# Patient Record
Sex: Female | Born: 2005 | Race: Black or African American | Hispanic: No | Marital: Single | Smoking: Never smoker
Health system: Southern US, Community
[De-identification: ages and names within clinical notes are randomized; demographics above are authoritative.]

---
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
## 2023-01-11 ENCOUNTER — Telehealth: Payer: Self-pay | Admitting: Family Medicine

## 2023-01-11 ENCOUNTER — Ambulatory Visit: Payer: 59 | Admitting: Family Medicine

## 2023-01-11 NOTE — Telephone Encounter (Signed)
NS no reason given

## 2023-01-12 NOTE — Telephone Encounter (Signed)
1st no show, sent text with policy (no mychart)

## 2023-09-01 ENCOUNTER — Ambulatory Visit (HOSPITAL_COMMUNITY): Payer: Self-pay

## 2023-09-06 ENCOUNTER — Ambulatory Visit (HOSPITAL_COMMUNITY)
Admission: RE | Admit: 2023-09-06 | Discharge: 2023-09-06 | Disposition: A | Discharge status: Home / Self Care | Payer: Self-pay | Source: Ambulatory Visit | Attending: Internal Medicine | Admitting: Internal Medicine

## 2023-09-06 ENCOUNTER — Encounter (HOSPITAL_COMMUNITY): Payer: Self-pay

## 2023-09-06 VITALS — BP 115/64 | HR 92 | Temp 98.9°F | Resp 16

## 2023-09-06 DIAGNOSIS — Z113 Encounter for screening for infections with a predominantly sexual mode of transmission: Secondary | ICD-10-CM | POA: Diagnosis present

## 2023-09-06 LAB — HIV ANTIBODY (ROUTINE TESTING W REFLEX): HIV Screen 4th Generation wRfx: NONREACTIVE

## 2023-09-06 NOTE — ED Triage Notes (Signed)
 Patient here today to be tested for all Stds. 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 Patient denies symptoms.

## 2023-09-06 NOTE — ED Provider Notes (Signed)
 [facility]    CSN: [id number] Arrival date & time: 09/06/23  1810      History   Chief Complaint Chief Complaint  Patient presents with   SEXUALLY TRANSMITTED DISEASE    HPI Sandy Allen is a 18 y.o. female.   18 y.o. female who presents to urgent care wanting to have STD testing.  She reports that her partner was told he tested positive for HSV-2.  He did not have any symptoms at the time of testing.  She is concerned and would like to have testing for this.  She does not have any lesions, open sores or genital concerns.  She denies any vaginal discharge or vaginal irritation.  She would like to have STD testing otherwise as well.     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 No pertinent surgical history.  OB History   No obstetric history on file.      Home Medications    Prior to Admission medications   Medication Sig Start Date End Date Taking? Authorizing Provider  ferrous sulfate  325 (65 FE) MG tablet Take 325 mg by mouth daily with breakfast. 04/22/19   [provider]  methocarbamol  (ROBAXIN ) 500 MG tablet Take 1 tablet (500 mg total) by mouth 2 (two) times daily. 12/06/22   Hildegard Loge, PA-C  traZODone  (DESYREL ) 50 MG tablet Take 1 tablet (50 mg total) by mouth at bedtime. 07/10/22   Sebastian Beverley NOVAK, MD    Family History Family History  Problem Relation Age of Onset   Diabetes Mother    Hypertension Mother    Diabetes Father    Hypertension Father     Social History Social History    Tobacco Use   Smoking status: Never    Passive exposure: Never   Smokeless tobacco: Never  Vaping Use   Vaping status: Former  Substance Use Topics   Alcohol use: Yes    Comment: occasionally   Drug use: Never     Allergies   Patient has no known allergies.   Review of Systems Review of Systems  Constitutional:  Negative for chills and fever.  HENT:  Negative for ear pain and sore throat.   Eyes:  Negative for pain and visual disturbance.  Respiratory:  Negative for cough and shortness of breath.   Cardiovascular:  Negative for chest pain and palpitations.  Gastrointestinal:  Negative for abdominal pain and vomiting.  Genitourinary:  Negative for dysuria and hematuria.  Musculoskeletal:  Negative for arthralgias and back pain.  Skin:  Negative for color change and rash.  Neurological:  Negative for seizures and syncope.  All other systems reviewed and are negative.    Physical Exam Triage Vital Signs ED Triage Vitals  Encounter Vitals Group     BP 09/06/23 1840 115/64     Girls Systolic BP Percentile --      Girls Diastolic BP Percentile --      Boys Systolic BP Percentile --      Boys Diastolic BP Percentile --  Pulse Rate 09/06/23 1840 92     Resp 09/06/23 1840 16     Temp 09/06/23 1840 98.9 F (37.2 C)     Temp Source 09/06/23 1840 Oral     SpO2 09/06/23 1840 100 %     Weight --      Height --      Head Circumference --      Peak Flow --      Pain Score 09/06/23 1841 0     Pain Loc --      Pain Education --      Exclude from Growth Chart --    No data found.  Updated Vital Signs BP 115/64 (BP Location: Left Arm)   Pulse 92   Temp 98.9 F (37.2 C) (Oral)   Resp 16   LMP 08/09/2023 (Approximate)   SpO2 100%   Visual Acuity Right Eye Distance:   Left Eye Distance:   Bilateral Distance:    Right Eye Near:   Left Eye Near:    Bilateral Near:     Physical Exam Vitals and nursing note reviewed.  Constitutional:      General: She is  not in acute distress.    Appearance: She is well-developed.  HENT:     Head: Normocephalic and atraumatic.  Eyes:     Conjunctiva/sclera: Conjunctivae normal.  Cardiovascular:     Rate and Rhythm: Normal rate and regular rhythm.     Heart sounds: No murmur heard. Pulmonary:     Effort: Pulmonary effort is normal. No respiratory distress.     Breath sounds: Normal breath sounds.  Abdominal:     Palpations: Abdomen is soft.     Tenderness: There is no abdominal tenderness.  Musculoskeletal:        General: No swelling.     Cervical back: Neck supple.  Skin:    General: Skin is warm and dry.     Capillary Refill: Capillary refill takes less than 2 seconds.  Neurological:     Mental Status: She is alert.  Psychiatric:        Mood and Affect: Mood normal.      UC Treatments / Results  Labs (all labs ordered are listed, but only abnormal results are displayed) Labs Reviewed  RPR  HIV ANTIBODY (ROUTINE TESTING W REFLEX)  CERVICOVAGINAL ANCILLARY ONLY    EKG   Radiology No results found.  Procedures Procedures (including critical care time)  Medications Ordered in UC Medications - No data to display  Initial Impression / Assessment and Plan / UC Course  I have reviewed the triage vital signs and the nursing notes.  Pertinent labs & imaging results that were available during my care of the patient were reviewed by me and considered in my medical decision making (see chart for details).     Screening examination for STI   Screening swab and blood work done today and results will be available in 24-48 hours. We will contact you if we need to arrange additional treatment based on your testing. Negative results will be on your MyChart account. Use a condom for sexual encounters. If you have any worsening or changing symptoms including abnormal discharge, pelvic pain, abdominal pain, fever, nausea, or vomiting, then you should be reevaluated.   Discussed with patient  that she does not have any lesions or open sores to culture for HSV 2 and do not recommend blood testing as this would only indicated exposure but not active disease.  Advised that  she could follow-up with gynecology to discuss this in more detail.   Final Clinical Impressions(s) / UC Diagnoses   Final diagnoses:  Screening examination for STI     Discharge Instructions      Screening swab and blood work done today and results will be available in 24-48 hours. We will contact you if we need to arrange additional treatment based on your testing. Negative results will be on your MyChart account. Use a condom for sexual encounters. If you have any worsening or changing symptoms including abnormal discharge, pelvic pain, abdominal pain, fever, nausea, or vomiting, then you should be reevaluated.    Recommend following up with gynecology or PCP to discuss HSV testing.     ED Prescriptions   None    PDMP not reviewed this encounter.   Teresa Almarie LABOR, [place] 09/06/23 1918

## 2023-09-06 NOTE — Discharge Instructions (Addendum)
 Screening swab and blood work done today and results will be available in 24-48 hours. We will contact you if we need to arrange additional treatment based on your testing. Negative results will be on your MyChart account. Use a condom for sexual encounters. If you have any worsening or changing symptoms including abnormal discharge, pelvic pain, abdominal pain, fever, nausea, or vomiting, then you should be reevaluated.    Recommend following up with gynecology or PCP to discuss HSV testing.

## 2023-09-07 LAB — RPR: RPR Ser Ql: NONREACTIVE

## 2023-09-08 ENCOUNTER — Ambulatory Visit (HOSPITAL_COMMUNITY): Payer: Self-pay

## 2023-09-08 LAB — CERVICOVAGINAL ANCILLARY ONLY
Bacterial Vaginitis (gardnerella): POSITIVE — AB
Chlamydia: NEGATIVE
Comment: NEGATIVE
Comment: NEGATIVE
Comment: NEGATIVE
Comment: NORMAL
Neisseria Gonorrhea: NEGATIVE
Trichomonas: NEGATIVE

## 2023-12-01 ENCOUNTER — Emergency Department (HOSPITAL_BASED_OUTPATIENT_CLINIC_OR_DEPARTMENT_OTHER)

## 2023-12-01 ENCOUNTER — Other Ambulatory Visit: Payer: Self-pay

## 2023-12-01 ENCOUNTER — Inpatient Hospital Stay (HOSPITAL_BASED_OUTPATIENT_CLINIC_OR_DEPARTMENT_OTHER)
Admission: EM | Admit: 2023-12-01 | Discharge: 2023-12-05 | DRG: 440 | Disposition: A | Discharge status: Home / Self Care | Attending: Internal Medicine | Admitting: Internal Medicine

## 2023-12-01 ENCOUNTER — Encounter (HOSPITAL_BASED_OUTPATIENT_CLINIC_OR_DEPARTMENT_OTHER): Payer: Self-pay | Admitting: Emergency Medicine

## 2023-12-01 DIAGNOSIS — K859 Acute pancreatitis without necrosis or infection, unspecified: Principal | ICD-10-CM | POA: Diagnosis present

## 2023-12-01 DIAGNOSIS — D649 Anemia, unspecified: Secondary | ICD-10-CM | POA: Diagnosis present

## 2023-12-01 DIAGNOSIS — Z1152 Encounter for screening for COVID-19: Secondary | ICD-10-CM

## 2023-12-01 DIAGNOSIS — F1290 Cannabis use, unspecified, uncomplicated: Secondary | ICD-10-CM | POA: Diagnosis present

## 2023-12-01 DIAGNOSIS — Z833 Family history of diabetes mellitus: Secondary | ICD-10-CM

## 2023-12-01 DIAGNOSIS — Z79899 Other long term (current) drug therapy: Secondary | ICD-10-CM

## 2023-12-01 DIAGNOSIS — Z8249 Family history of ischemic heart disease and other diseases of the circulatory system: Secondary | ICD-10-CM

## 2023-12-01 LAB — COMPREHENSIVE METABOLIC PANEL WITH GFR
ALT: 9 U/L (ref 0–44)
AST: 18 U/L (ref 15–41)
Albumin: 4.7 g/dL (ref 3.5–5.0)
Alkaline Phosphatase: 46 U/L (ref 38–126)
Anion gap: 10 (ref 5–15)
BUN: 13 mg/dL (ref 6–20)
CO2: 27 mmol/L (ref 22–32)
Calcium: 9.7 mg/dL (ref 8.9–10.3)
Chloride: 103 mmol/L (ref 98–111)
Creatinine, Ser: 0.82 mg/dL (ref 0.44–1.00)
GFR, Estimated: >60 mL/min (ref >60)
Glucose, Bld: 94 mg/dL (ref 70–99)
Potassium: 3.6 mmol/L (ref 3.5–5.1)
Sodium: 140 mmol/L (ref 135–145)
Total Bilirubin: 0.6 mg/dL (ref 0.0–1.2)
Total Protein: 8 g/dL (ref 6.5–8.1)

## 2023-12-01 LAB — URINALYSIS, ROUTINE W REFLEX MICROSCOPIC
Bacteria, UA: NONE SEEN
Bilirubin Urine: NEGATIVE
Glucose, UA: NEGATIVE mg/dL
Ketones, ur: 15 mg/dL — AB
Leukocytes,Ua: NEGATIVE
Nitrite: NEGATIVE
Specific Gravity, Urine: 1.032 — ABNORMAL HIGH (ref 1.005–1.030)
pH: 6 (ref 5.0–8.0)

## 2023-12-01 LAB — CBC
HCT: 31.7 % — ABNORMAL LOW (ref 36.0–46.0)
Hemoglobin: 9.8 g/dL — ABNORMAL LOW (ref 12.0–15.0)
MCH: 24.3 pg — ABNORMAL LOW (ref 26.0–34.0)
MCHC: 30.9 g/dL (ref 30.0–36.0)
MCV: 78.5 fL — ABNORMAL LOW (ref 80.0–100.0)
Platelets: 385 K/uL (ref 150–400)
RBC: 4.04 MIL/uL (ref 3.87–5.11)
RDW: 16.5 % — ABNORMAL HIGH (ref 11.5–15.5)
WBC: 8.9 K/uL (ref 4.0–10.5)
nRBC: 0 % (ref 0.0–0.2)

## 2023-12-01 LAB — PREGNANCY, URINE: Preg Test, Ur: NEGATIVE

## 2023-12-01 LAB — LIPASE, BLOOD: Lipase: 884 U/L — ABNORMAL HIGH (ref 11–51)

## 2023-12-01 MED ORDER — IOHEXOL 300 MG/ML  SOLN
100.0000 mL | Freq: Once | INTRAMUSCULAR | Status: AC | PRN
  Administered 2023-12-01: 85 mL via INTRAVENOUS

## 2023-12-01 MED ORDER — SODIUM CHLORIDE 0.9 % IV BOLUS
1000.0000 mL | Freq: Once | INTRAVENOUS | Status: AC
  Administered 2023-12-01: 1000 mL via INTRAVENOUS

## 2023-12-01 NOTE — ED Provider Notes (Signed)
 [place] [facility] AT [facility] Provider Note   CSN: [id number] Arrival date & time: 12/01/23  2031     Patient presents with: Abdominal Pain   Sandy Allen is a 18 y.o. female.  18 year old female presents ED with complaints of 5 days of left upper quadrant abdominal pain.  Patient reports pain is more significant after eating and resolves shortly after.  Patient denies any known history of GERD or gastric ulcers.  Patient denies any nausea, vomiting, diarrhea, constipation.  Patient reports she is also late on her period and there is a chance that she may be pregnant.  Patient does not have any associated urinary symptoms and denies any daily medications that she takes.     Prior to Admission medications   Medication Sig Start Date End Date Taking? Authorizing Provider  ferrous sulfate  325 (65 FE) MG tablet Take 325 mg by mouth daily with breakfast. 04/22/19   [provider]  methocarbamol  (ROBAXIN ) 500 MG tablet Take 1 tablet (500 mg total) by mouth 2 (two) times daily. 12/06/22   Hildegard, Amjad, PA-C  traZODone  (DESYREL ) 50 MG tablet Take 1 tablet (50 mg total) by mouth at bedtime. 07/10/22   Sebastian Beverley NOVAK, MD    Allergies: Patient has no known allergies.    Review of Systems  Gastrointestinal:  Positive for abdominal pain.  All other systems reviewed and are negative.   Updated Vital Signs BP (!) 134/59 (BP Location: Right Arm)   Pulse 75   Temp 98.3 F (36.8 C) (Oral)   Resp 12   Ht 5' 8 (1.727 m)   Wt 72.6 kg   LMP 10/26/2023 (Approximate)   SpO2 99%   BMI 24.33 kg/m   Physical Exam Vitals and nursing note reviewed.  Constitutional:      Appearance: Normal appearance. She is well-developed.  HENT:     Head: Normocephalic and atraumatic.     Nose: Nose normal.  Eyes:     Extraocular Movements: Extraocular movements intact.     Conjunctiva/sclera: Conjunctivae normal.     Pupils: Pupils are equal, round, and reactive to  light.  Cardiovascular:     Rate and Rhythm: Normal rate.  Pulmonary:     Effort: Pulmonary effort is normal. No respiratory distress.     Breath sounds: Normal breath sounds.  Abdominal:     General: Abdomen is flat. Bowel sounds are normal.     Palpations: There is no shifting dullness or fluid wave.     Tenderness: There is abdominal tenderness in the epigastric area and left upper quadrant. There is no right CVA tenderness, left CVA tenderness or guarding.  Musculoskeletal:        General: Normal range of motion.     Cervical back: Normal range of motion.  Skin:    General: Skin is warm.     Capillary Refill: Capillary refill takes less than 2 seconds.  Neurological:     General: No focal deficit present.     Mental Status: She is alert.  Psychiatric:        Mood and Affect: Mood normal.        Behavior: Behavior normal.     (all labs ordered are listed, but only abnormal results are displayed) Labs Reviewed  LIPASE, BLOOD - Abnormal; Notable for the following components:      Result Value   Lipase 884 (*)    All other components within normal limits  CBC - Abnormal; Notable for  the following components:   Hemoglobin 9.8 (*)    HCT 31.7 (*)    MCV 78.5 (*)    MCH 24.3 (*)    RDW 16.5 (*)    All other components within normal limits  URINALYSIS, ROUTINE W REFLEX MICROSCOPIC - Abnormal; Notable for the following components:   Specific Gravity, Urine 1.032 (*)    Hgb urine dipstick MODERATE (*)    Ketones, ur 15 (*)    Protein, ur TRACE (*)    All other components within normal limits  COMPREHENSIVE METABOLIC PANEL WITH GFR  PREGNANCY, URINE  TRIGLYCERIDES    EKG: None  Radiology: CT ABDOMEN PELVIS W CONTRAST Result Date: 12/01/2023 EXAM: CT ABDOMEN AND PELVIS WITH CONTRAST 12/01/2023 10:55:22 PM TECHNIQUE: CT of the abdomen and pelvis was performed with the administration of 85 mL of iohexol (OMNIPAQUE) 300 MG/ML solution. Multiplanar reformatted images are  provided for review. Automated exposure control, iterative reconstruction, and/or weight-based adjustment of the mA/kV was utilized to reduce the radiation dose to as low as reasonably achievable. COMPARISON: None available. CLINICAL HISTORY: Abdominal pain, acute, nonlocalized; Elevated lipase. Pt c/o epigastric pain after meals x 5 days, possibly late on menstrual cycle. LMP 10/26/23. Upper abd pain rated 4/10, worse after eating, tenderness in RUQ. FINDINGS: LOWER CHEST: No acute abnormality. LIVER: The liver is unremarkable. GALLBLADDER AND BILE DUCTS: Gallbladder is unremarkable. No biliary ductal dilatation. SPLEEN: No acute abnormality. PANCREAS: Mild peripancreatic fluid/inflammatory changes along the pancreatic tail (image 25), suggesting uncomplicated acute pancreatitis. ADRENAL GLANDS: No acute abnormality. KIDNEYS, URETERS AND BLADDER: No stones in the kidneys or ureters. No hydronephrosis. No perinephric or periureteral stranding. Urinary bladder is unremarkable. GI AND BOWEL: Stomach demonstrates no acute abnormality. There is no bowel obstruction. Normal appendix (image 62). PERITONEUM AND RETROPERITONEUM: No ascites. No free air. VASCULATURE: Aorta is normal in caliber. LYMPH NODES: No lymphadenopathy. REPRODUCTIVE ORGANS: The uterus is within normal limits. BONES AND SOFT TISSUES: No acute osseous abnormality. No focal soft tissue abnormality. IMPRESSION: 1. Mild uncomplicated acute pancreatitis. Electronically signed by: Pinkie Pebbles MD 12/01/2023 10:58 PM EDT RP Workstation: [id number]     Procedures   Medications Ordered in the ED  iohexol (OMNIPAQUE) 300 MG/ML solution 100 mL (85 mLs Intravenous Contrast Given 12/01/23 2249)  sodium chloride 0.9 % bolus 1,000 mL (1,000 mLs Intravenous New Bag/Given 12/01/23 2337)    18 y.o. female presents to the ED with complaints of abdominal pain, this involves an extensive number of treatment options, and is a complaint that carries with it a  high risk of complications and morbidity.  The differential diagnosis includes cholecystitis, appendicitis, gastritis, pancreatitis, pyelonephritis, hydronephrosis, nephrolithiasis, ureterolithiasis, bowel obstruction, AAA, gastric ulcer, (Ddx)  On arrival pt is nontoxic, vitals unremarkable. Exam significant for epigastric and left upper quadrant pain to palpation  Lab Tests:  I Ordered, reviewed, and interpreted labs, which included: CMP, CBC, UA, lipase, pregnancy  Imaging Studies ordered:  I ordered imaging studies which included CT abdomen pelvis, I independently visualized and interpreted imaging which showed acute uncomplicated pancreatitis  ED Course:   18 year old female presents ED with complaints of left upper quadrant and epigastric pain for 5 days.  On exam patient is sitting comfortably in ED bed in no acute distress nontoxic-appearing.  Initial vitals are unremarkable.  Patient has significant tenderness to the left upper quadrant and epigastric on exam.  Patient has some associated mild generalized tenderness throughout as well.  Patient has no noted rashes on her abdomen.  There  is no swelling in her distal extremities.  Patient denies any other associated symptoms including nausea vomiting diarrhea constipation.  Patient does have increased pain after eating which decreases in severity shortly after eating.  Patient has elevated lipase on initial exams.  Patient denies any alcohol use.  Patient does endorse daily marijuana use.  Patient reports muscle relaxer use as needed.  Patient denies any other medications.  CT abdomen pelvis will be ordered for further evaluation.  CT resulted and acute uncomplicated pancreatitis.  Patient was started on IV fluids and hospitalist was consulted.  Dr. Shona was consulted and agreed to admit patient for pancreatitis.  Patient was advised of findings and advised of admission.  Patient agreed with treatment plan and was advised to not eat anything.   On reevaluation patient was still sitting comfortably in ED and denied any current nausea and denied any management of pain.  Patient care was transferred at this time to admitting team Dr. Shona.  Portions of this note were generated with Scientist, clinical (histocompatibility and immunogenetics). Dictation errors may occur despite best attempts at proofreading.   Final diagnoses:  Acute pancreatitis, unspecified complication status, unspecified pancreatitis type    ED Discharge Orders     None          Myriam Fonda GORMAN DEVONNA 12/01/23 2354    Randol Simmonds, MD 12/02/23 1135

## 2023-12-01 NOTE — ED Triage Notes (Signed)
 Pt c/o epigastric pain after meals x 5 days, possibly late on menstrual cycle. LMP 10/26/23. Upper abd pain rated 4/10, worse after eating, tenderness in RUQ.

## 2023-12-01 NOTE — ED Notes (Signed)
 Patient transported to CT

## 2023-12-01 NOTE — ED Notes (Signed)
 PA at bedside.

## 2023-12-02 DIAGNOSIS — Z8249 Family history of ischemic heart disease and other diseases of the circulatory system: Secondary | ICD-10-CM | POA: Diagnosis not present

## 2023-12-02 DIAGNOSIS — D649 Anemia, unspecified: Secondary | ICD-10-CM | POA: Diagnosis present

## 2023-12-02 DIAGNOSIS — Z1152 Encounter for screening for COVID-19: Secondary | ICD-10-CM | POA: Diagnosis not present

## 2023-12-02 DIAGNOSIS — Z833 Family history of diabetes mellitus: Secondary | ICD-10-CM | POA: Diagnosis not present

## 2023-12-02 DIAGNOSIS — F1290 Cannabis use, unspecified, uncomplicated: Secondary | ICD-10-CM | POA: Insufficient documentation

## 2023-12-02 DIAGNOSIS — K859 Acute pancreatitis without necrosis or infection, unspecified: Secondary | ICD-10-CM | POA: Diagnosis present

## 2023-12-02 DIAGNOSIS — K8500 Idiopathic acute pancreatitis without necrosis or infection: Secondary | ICD-10-CM | POA: Diagnosis not present

## 2023-12-02 DIAGNOSIS — F1210 Cannabis abuse, uncomplicated: Secondary | ICD-10-CM | POA: Diagnosis not present

## 2023-12-02 DIAGNOSIS — Z79899 Other long term (current) drug therapy: Secondary | ICD-10-CM | POA: Diagnosis not present

## 2023-12-02 LAB — URINE DRUG SCREEN
Amphetamines: NEGATIVE
Barbiturates: NEGATIVE
Benzodiazepines: NEGATIVE
Cocaine: NEGATIVE
Fentanyl: NEGATIVE
Methadone Scn, Ur: NEGATIVE
Opiates: NEGATIVE
Tetrahydrocannabinol: POSITIVE — AB

## 2023-12-02 LAB — CBC WITH DIFFERENTIAL/PLATELET
Abs Immature Granulocytes: 0.01 K/uL (ref 0.00–0.07)
Basophils Absolute: 0 K/uL (ref 0.0–0.1)
Basophils Relative: 1 %
Eosinophils Absolute: 0.1 K/uL (ref 0.0–0.5)
Eosinophils Relative: 1 %
HCT: 30.7 % — ABNORMAL LOW (ref 36.0–46.0)
Hemoglobin: 8.7 g/dL — ABNORMAL LOW (ref 12.0–15.0)
Immature Granulocytes: 0 %
Lymphocytes Relative: 38 %
Lymphs Abs: 2.9 K/uL (ref 0.7–4.0)
MCH: 23.1 pg — ABNORMAL LOW (ref 26.0–34.0)
MCHC: 28.3 g/dL — ABNORMAL LOW (ref 30.0–36.0)
MCV: 81.6 fL (ref 80.0–100.0)
Monocytes Absolute: 0.6 K/uL (ref 0.1–1.0)
Monocytes Relative: 8 %
Neutro Abs: 4 K/uL (ref 1.7–7.7)
Neutrophils Relative %: 52 %
Platelets: 324 K/uL (ref 150–400)
RBC: 3.76 MIL/uL — ABNORMAL LOW (ref 3.87–5.11)
RDW: 16.4 % — ABNORMAL HIGH (ref 11.5–15.5)
WBC: 7.6 K/uL (ref 4.0–10.5)
nRBC: 0 % (ref 0.0–0.2)

## 2023-12-02 LAB — COMPREHENSIVE METABOLIC PANEL WITH GFR
ALT: 7 U/L (ref 0–44)
AST: 16 U/L (ref 15–41)
Albumin: 3.8 g/dL (ref 3.5–5.0)
Alkaline Phosphatase: 37 U/L — ABNORMAL LOW (ref 38–126)
Anion gap: 9 (ref 5–15)
BUN: 10 mg/dL (ref 6–20)
CO2: 23 mmol/L (ref 22–32)
Calcium: 8.5 mg/dL — ABNORMAL LOW (ref 8.9–10.3)
Chloride: 106 mmol/L (ref 98–111)
Creatinine, Ser: 0.61 mg/dL (ref 0.44–1.00)
GFR, Estimated: >60 mL/min (ref >60)
Glucose, Bld: 88 mg/dL (ref 70–99)
Potassium: 3.5 mmol/L (ref 3.5–5.1)
Sodium: 137 mmol/L (ref 135–145)
Total Bilirubin: 0.6 mg/dL (ref 0.0–1.2)
Total Protein: 6.3 g/dL — ABNORMAL LOW (ref 6.5–8.1)

## 2023-12-02 LAB — MAGNESIUM: Magnesium: 2 mg/dL (ref 1.7–2.4)

## 2023-12-02 LAB — TRIGLYCERIDES
Triglycerides: 37 mg/dL (ref <150)
Triglycerides: 34 mg/dL (ref <150)

## 2023-12-02 MED ORDER — HYDROMORPHONE HCL 1 MG/ML IJ SOLN
0.5000 mg | INTRAMUSCULAR | Status: DC | PRN
  Administered 2023-12-03: 0.5 mg via INTRAVENOUS
  Filled 2023-12-02: qty 0.5

## 2023-12-02 MED ORDER — ONDANSETRON HCL 4 MG/2ML IJ SOLN: 4.0000 mg | Freq: Four times a day (QID) | INTRAMUSCULAR | Status: DC | PRN

## 2023-12-02 MED ORDER — LACTATED RINGERS IV SOLN: INTRAVENOUS | Status: AC

## 2023-12-02 MED ORDER — ACETAMINOPHEN 325 MG PO TABS
650.0000 mg | ORAL_TABLET | Freq: Four times a day (QID) | ORAL | Status: DC | PRN
  Filled 2023-12-02: qty 2

## 2023-12-02 MED ORDER — NALOXONE HCL 0.4 MG/ML IJ SOLN: 0.4000 mg | INTRAMUSCULAR | Status: DC | PRN

## 2023-12-02 MED ORDER — MORPHINE SULFATE (PF) 4 MG/ML IV SOLN
4.0000 mg | Freq: Once | INTRAVENOUS | Status: AC
  Administered 2023-12-02: 4 mg via INTRAVENOUS
  Filled 2023-12-02: qty 1

## 2023-12-02 MED ORDER — ACETAMINOPHEN 650 MG RE SUPP: 650.0000 mg | Freq: Four times a day (QID) | RECTAL | Status: DC | PRN

## 2023-12-02 NOTE — Assessment & Plan Note (Addendum)
 Progressive worsening abdominal pain x 5 days Noted lipase of 600 with CT imaging concerning for acute uncomplicated pancreatitis No reported alcohol use No evidence of biliary involvement at present-LFTs within normal limits Triglyceride level within normal limits With otherwise symptomatically managed IV fluids Pain control Antiemetics Monitor

## 2023-12-02 NOTE — Assessment & Plan Note (Signed)
 Patient reports regular marijuana use  UDS pending  Monitor

## 2023-12-02 NOTE — Plan of Care (Signed)
  Problem: Clinical Measurements: Goal: Ability to maintain clinical measurements within normal limits will improve Outcome: Progressing Goal: Diagnostic test results will improve Outcome: Progressing   Problem: Pain Managment: Goal: General experience of comfort will improve and/or be controlled Outcome: Progressing

## 2023-12-02 NOTE — H&P (Signed)
 History and Physical    Patient: Sandy Allen FMW:[id number] DOB: [birth year] DOA: 12/01/2023 DOS: the patient was seen and examined on 12/02/2023 PCP: Sebastian Beverley NOVAK, MD  Patient coming from: Home  Chief Complaint:  Chief Complaint  Patient presents with   Abdominal Pain   HPI: Sandy Allen is a 18 y.o. female with medical history significant of no separate prior medical history presenting with abdominal pain and pancreatitis.  Patient reports approximately 5 days of moderate to severe abdominal pain after eating.  Pain predominate generalized.  Some epigastric as well as left upper quadrant predominance.  No diarrhea.  No vomiting.  Mild nausea.  Denies any prior episodes like this in the past.  Does admit to eating fairly high fat diet over the past week or so including [employer], and pizza.  Denies any alcohol use.  Smokes 1 marijuana joint daily.  No reported prior abdominal surgeries.  No reported family history of similar issues.  No dysuria or increased urinary frequency. Presents to the ER afebrile, hemodynamically stable.  Satting well room air.  White count 8.9, hemoglobin 9.8, platelets 385, creatinine 0.2.  AST as well as T. bili grossly within normal limits.  Triglycerides 34.  CT abdomen pelvis with mild uncomplicated acute pancreatitis.  Normal biliary imaging. Review of Systems: As mentioned in the history of present illness. All other systems reviewed and are negative. 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 No pertinent surgical history. Social History:  reports that she has never smoked. She has never been exposed to tobacco smoke. She has never used smokeless tobacco. She reports current alcohol use. She reports that she does not use drugs.  No Known Allergies  Family History  Problem Relation Age of Onset   Diabetes Mother    Hypertension Mother    Diabetes Father     Hypertension Father     Prior to Admission medications   Medication Sig Start Date End Date Taking? Authorizing Provider  ferrous sulfate  325 (65 FE) MG tablet Take 325 mg by mouth daily with breakfast. 04/22/19   [provider]  methocarbamol  (ROBAXIN ) 500 MG tablet Take 1 tablet (500 mg total) by mouth 2 (two) times daily. 12/06/22   Hildegard Loge, PA-C  traZODone  (DESYREL ) 50 MG tablet Take 1 tablet (50 mg total) by mouth at bedtime. 07/10/22   Sebastian Beverley NOVAK, MD    Physical Exam: Vitals:   12/02/23 0041 12/02/23 0206 12/02/23 0500 12/02/23 0543  BP: 129/78 (!) 129/57  132/75  Pulse: 62 60  63  Resp: 16 16  16   Temp: 98.6 F (37 C) 97.6 F (36.4 C)  98.3 F (36.8 C)  TempSrc:  Oral  Oral  SpO2: 100% 100%  100%  Weight:   71.3 kg   Height:       Physical Exam Constitutional:      Appearance: She is normal weight.  HENT:     Head: Normocephalic and atraumatic.     Mouth/Throat:     Mouth: Mucous membranes are moist.  Eyes:     Pupils: Pupils are equal, round, and reactive to light.  Cardiovascular:     Rate and Rhythm: Normal rate and regular rhythm.  Pulmonary:     Effort: Pulmonary effort is normal.  Abdominal:     General: Bowel sounds are normal.  Musculoskeletal:        General: Normal range of motion.  Skin:  General: Skin is warm.  Neurological:     General: No focal deficit present.  Psychiatric:        Mood and Affect: Mood normal.     Data Reviewed:  There are no new results to review at this time.  CT ABDOMEN PELVIS W CONTRAST EXAM: CT ABDOMEN AND PELVIS WITH CONTRAST 12/01/2023 10:55:22 PM  TECHNIQUE: CT of the abdomen and pelvis was performed with the administration of 85 mL of iohexol (OMNIPAQUE) 300 MG/ML solution. Multiplanar reformatted images are provided for review. Automated exposure control, iterative reconstruction, and/or weight-based adjustment of the mA/kV was utilized to reduce the radiation dose to as low as  reasonably achievable.  COMPARISON: None available.  CLINICAL HISTORY: Abdominal pain, acute, nonlocalized; Elevated lipase. Pt c/o epigastric pain after meals x 5 days, possibly late on menstrual cycle. LMP 10/26/23. Upper abd pain rated 4/10, worse after eating, tenderness in RUQ.  FINDINGS:  LOWER CHEST: No acute abnormality.  LIVER: The liver is unremarkable.  GALLBLADDER AND BILE DUCTS: Gallbladder is unremarkable. No biliary ductal dilatation.  SPLEEN: No acute abnormality.  PANCREAS: Mild peripancreatic fluid/inflammatory changes along the pancreatic tail (image 25), suggesting uncomplicated acute pancreatitis.  ADRENAL GLANDS: No acute abnormality.  KIDNEYS, URETERS AND BLADDER: No stones in the kidneys or ureters. No hydronephrosis. No perinephric or periureteral stranding. Urinary bladder is unremarkable.  GI AND BOWEL: Stomach demonstrates no acute abnormality. There is no bowel obstruction. Normal appendix (image 62).  PERITONEUM AND RETROPERITONEUM: No ascites. No free air.  VASCULATURE: Aorta is normal in caliber.  LYMPH NODES: No lymphadenopathy.  REPRODUCTIVE ORGANS: The uterus is within normal limits.  BONES AND SOFT TISSUES: No acute osseous abnormality. No focal soft tissue abnormality.  IMPRESSION: 1. Mild uncomplicated acute pancreatitis.  Electronically signed by: Pinkie Pebbles MD 12/01/2023 10:58 PM EDT RP Workstation: [id number]  Lab Results  Component Value Date   WBC 7.6 12/02/2023   HGB 8.7 (L) 12/02/2023   HCT 30.7 (L) 12/02/2023   MCV 81.6 12/02/2023   PLT 324 12/02/2023   Last metabolic panel Lab Results  Component Value Date   GLUCOSE 88 12/02/2023   NA 137 12/02/2023   K 3.5 12/02/2023   CL 106 12/02/2023   CO2 23 12/02/2023   BUN 10 12/02/2023   CREATININE 0.61 12/02/2023   GFRNONAA >60 12/02/2023   CALCIUM 8.5 (L) 12/02/2023   PROT 6.3 (L) 12/02/2023   ALBUMIN 3.8 12/02/2023   BILITOT 0.6 12/02/2023    ALKPHOS 37 (L) 12/02/2023   AST 16 12/02/2023   ALT 7 12/02/2023   ANIONGAP 9 12/02/2023    Assessment and Plan: * Acute pancreatitis Progressive worsening abdominal pain x 5 days Noted lipase of 600 with CT imaging concerning for acute uncomplicated pancreatitis No reported alcohol use No evidence of biliary involvement at present Triglyceride level within normal limits With otherwise symptomatically managed IV fluids Pain control Antiemetics Monitor      Advance Care Planning:   Code Status: Full Code   Consults: None   Family Communication: Family at the bedside   Severity of Illness: The appropriate patient status for this patient is OBSERVATION. Observation status is judged to be reasonable and necessary in order to provide the required intensity of service to ensure the patient's safety. The patient's presenting symptoms, physical exam findings, and initial radiographic and laboratory data in the context of their medical condition is felt to place them at decreased risk for further clinical deterioration. Furthermore, it is anticipated that the patient  will be medically stable for discharge from the hospital within 2 midnights of admission.   Author: Elspeth JINNY Masters, MD 12/02/2023 8:17 AM  For on call review [email or web address].

## 2023-12-02 NOTE — Progress Notes (Signed)
(  Carryover admission to the Day Admitter; accepted by Dr.  Terry Hurst as transfer from  [facility]  to a  med-surg bed at  [facility]  for  acute pancreatitis. Please see Dr. Milford transfer documentation in [facility] Communication for additional details).    I have placed some additional preliminary admit orders via the adult multi-morbid admission order set. I have also ordered clear liquid diet, prn iv zofran , prn iv Dilaudid, incentive spirometry, lactated Ringer's at 150 cc/h, as well as morning labs that include CMP, CBC, and magnesium level.  There is an existing order for triglyceride level to be checked with morning labs.    Eva Pore, DO Hospitalist

## 2023-12-03 DIAGNOSIS — K859 Acute pancreatitis without necrosis or infection, unspecified: Secondary | ICD-10-CM | POA: Diagnosis not present

## 2023-12-03 LAB — IRON AND TIBC
Iron: 73 ug/dL (ref 28–170)
Saturation Ratios: 18 % (ref 10.4–31.8)
TIBC: 400 ug/dL (ref 250–450)
UIBC: 327 ug/dL

## 2023-12-03 LAB — RETICULOCYTES
Immature Retic Fract: 15.3 % (ref 2.3–15.9)
RBC.: 4.08 MIL/uL (ref 3.87–5.11)
Retic Count, Absolute: 38.8 K/uL (ref 19.0–186.0)
Retic Ct Pct: 1 % (ref 0.4–3.1)

## 2023-12-03 LAB — FERRITIN: Ferritin: 14 ng/mL (ref 11–307)

## 2023-12-03 LAB — VITAMIN B12: Vitamin B-12: 511 pg/mL (ref 180–914)

## 2023-12-03 LAB — FOLATE: Folate: 11.4 ng/mL (ref >5.9)

## 2023-12-03 MED ORDER — LACTATED RINGERS IV SOLN: INTRAVENOUS | Status: AC

## 2023-12-03 MED ORDER — HYDROCODONE-ACETAMINOPHEN 5-325 MG PO TABS
1.0000 | ORAL_TABLET | Freq: Four times a day (QID) | ORAL | Status: DC | PRN
Start: 2023-12-03 — End: 2023-12-05
  Administered 2023-12-04: 1 via ORAL
  Filled 2023-12-03: qty 1

## 2023-12-03 MED ORDER — LACTATED RINGERS IV SOLN: INTRAVENOUS | Status: DC

## 2023-12-03 MED ORDER — PANTOPRAZOLE SODIUM 40 MG IV SOLR
40.0000 mg | Freq: Two times a day (BID) | INTRAVENOUS | Status: DC
  Administered 2023-12-03 – 2023-12-05 (×5): 40 mg via INTRAVENOUS
  Filled 2023-12-03 (×5): qty 10

## 2023-12-03 MED ORDER — ENOXAPARIN SODIUM 40 MG/0.4ML IJ SOSY
40.0000 mg | PREFILLED_SYRINGE | INTRAMUSCULAR | Status: DC
  Administered 2023-12-03 – 2023-12-04 (×2): 40 mg via SUBCUTANEOUS
  Filled 2023-12-03 (×2): qty 0.4

## 2023-12-03 NOTE — Progress Notes (Signed)
 PROGRESS NOTE    Sandy Allen  FMW:[id number] DOB: [birth year] DOA: 12/01/2023 PCP: Sebastian Beverley NOVAK, MD   Brief Narrative: 18 year old with no significant past medical history presented with abdominal pain, generalized and epigastric.  Denies alcohol use.  Reports smoking marijuana.  Evaluation in the ED liver function test normal, CT abdomen and pelvis with mild uncomplicated pancreatitis.  Lipase 800.   Assessment & Plan:   Principal Problem:   Acute pancreatitis Active Problems:   Marijuana use  1-Acute pancreatitis: Unclear etiology - Patient presented with abdominal pain, found to have elevated lipase and mild pancreatitis on CT. - No evidence of gallstone.  Triglyceride 34. - Will check IgG4 Discussed with patient that marijuana can cause pancreatitis.  Advised cessation. Plan to start full liquid diet, reported improvement of abdominal pain.  Anemia: - Check panel  Estimated body mass index is 24.3 kg/m as calculated from the following:   Height as of this encounter: 5' 8 (1.727 m).   Weight as of this encounter: 72.5 kg.   DVT prophylaxis: Lovenox Code Status: Full code Family Communication: Care discussed with patient Disposition Plan:  Status is: Inpatient Remains inpatient appropriate because; management of acute pancreatitis    Consultants:  None  Procedures:  None  Antimicrobials:    Subjective: Reports improvement of abdominal pain, no significant pain this morning.  Has been tolerating clear liquid diet.   Objective: Vitals:   12/02/23 2040 12/03/23 0123 12/03/23 0501 12/03/23 0544  BP: (!) 140/67 138/79 111/70   Pulse: 70 (!) 55 (!) 56   Resp: 15 15 15    Temp: 98.8 F (37.1 C) 98.6 F (37 C) 97.9 F (36.6 C)   TempSrc: Oral Oral Oral   SpO2: 99% 100% 100%   Weight:    72.5 kg  Height:        Intake/Output Summary (Last 24 hours) at 12/03/2023 0723 Last data filed at 12/03/2023 0544 Gross per 24 hour  Intake 3831.07 ml   Output 1300 ml  Net 2531.07 ml   Filed Weights   12/01/23 2041 12/02/23 0500 12/03/23 0544  Weight: 72.6 kg 71.3 kg 72.5 kg    Examination:  General exam: Appears calm and comfortable  Respiratory system: Clear to auscultation. Respiratory effort normal. Cardiovascular system: S1 & S2 heard, RRR. No JVD, murmurs, rubs, gallops or clicks. No pedal edema. Gastrointestinal system: Abdomen is nondistended, soft and nontender. No organomegaly or masses felt. Normal bowel sounds heard. Central nervous system: Alert and oriented. No focal neurological deficits. Extremities: Symmetric 5 x 5 power. Skin: No rashes, lesions or ulcers   Data Reviewed: I have personally reviewed following labs and imaging studies  CBC: Recent Labs  Lab 12/01/23 2050 12/02/23 0455  WBC 8.9 7.6  NEUTROABS  --  4.0  HGB 9.8* 8.7*  HCT 31.7* 30.7*  MCV 78.5* 81.6  PLT 385 324   Basic Metabolic Panel: Recent Labs  Lab 12/01/23 2050 12/02/23 0455  NA 140 137  K 3.6 3.5  CL 103 106  CO2 27 23  GLUCOSE 94 88  BUN 13 10  CREATININE 0.82 0.61  CALCIUM 9.7 8.5*  MG  --  2.0   GFR: Estimated Creatinine Clearance: 113.2 mL/min (by C-G formula based on SCr of 0.61 mg/dL). Liver Function Tests: Recent Labs  Lab 12/01/23 2050 12/02/23 0455  AST 18 16  ALT 9 7  ALKPHOS 46 37*  BILITOT 0.6 0.6  PROT 8.0 6.3*  ALBUMIN 4.7 3.8   Recent Labs  Lab 12/01/23 2050  LIPASE 884*   No results for input(s): AMMONIA in the last 168 hours. Coagulation Profile: No results for input(s): INR, PROTIME in the last 168 hours. Cardiac Enzymes: No results for input(s): CKTOTAL, CKMB, CKMBINDEX, TROPONINI in the last 168 hours. BNP (last 3 results) No results for input(s): PROBNP in the last 8760 hours. HbA1C: No results for input(s): HGBA1C in the last 72 hours. CBG: No results for input(s): GLUCAP in the last 168 hours. Lipid Profile: Recent Labs    12/02/23 0455  TRIG 34    Thyroid Function Tests: No results for input(s): TSH, T4TOTAL, FREET4, T3FREE, THYROIDAB in the last 72 hours. Anemia Panel: No results for input(s): VITAMINB12, FOLATE, FERRITIN, TIBC, IRON, RETICCTPCT in the last 72 hours. Sepsis Labs: No results for input(s): PROCALCITON, LATICACIDVEN in the last 168 hours.  No results found for this or any previous visit (from the past 240 hours).       Radiology Studies: CT ABDOMEN PELVIS W CONTRAST Result Date: 12/01/2023 EXAM: CT ABDOMEN AND PELVIS WITH CONTRAST 12/01/2023 10:55:22 PM TECHNIQUE: CT of the abdomen and pelvis was performed with the administration of 85 mL of iohexol (OMNIPAQUE) 300 MG/ML solution. Multiplanar reformatted images are provided for review. Automated exposure control, iterative reconstruction, and/or weight-based adjustment of the mA/kV was utilized to reduce the radiation dose to as low as reasonably achievable. COMPARISON: None available. CLINICAL HISTORY: Abdominal pain, acute, nonlocalized; Elevated lipase. Pt c/o epigastric pain after meals x 5 days, possibly late on menstrual cycle. LMP 10/26/23. Upper abd pain rated 4/10, worse after eating, tenderness in RUQ. FINDINGS: LOWER CHEST: No acute abnormality. LIVER: The liver is unremarkable. GALLBLADDER AND BILE DUCTS: Gallbladder is unremarkable. No biliary ductal dilatation. SPLEEN: No acute abnormality. PANCREAS: Mild peripancreatic fluid/inflammatory changes along the pancreatic tail (image 25), suggesting uncomplicated acute pancreatitis. ADRENAL GLANDS: No acute abnormality. KIDNEYS, URETERS AND BLADDER: No stones in the kidneys or ureters. No hydronephrosis. No perinephric or periureteral stranding. Urinary bladder is unremarkable. GI AND BOWEL: Stomach demonstrates no acute abnormality. There is no bowel obstruction. Normal appendix (image 62). PERITONEUM AND RETROPERITONEUM: No ascites. No free air. VASCULATURE: Aorta is normal in caliber.  LYMPH NODES: No lymphadenopathy. REPRODUCTIVE ORGANS: The uterus is within normal limits. BONES AND SOFT TISSUES: No acute osseous abnormality. No focal soft tissue abnormality. IMPRESSION: 1. Mild uncomplicated acute pancreatitis. Electronically signed by: Pinkie Pebbles MD 12/01/2023 10:58 PM EDT RP Workstation: [id number]        Scheduled Meds: Continuous Infusions:  lactated ringers 125 mL/hr at 12/03/23 0418     LOS: 1 day    Time spent: 35 minutes    Prentiss Hammett A Tremayne Sheldon, MD [facility]   If 7PM-7AM, please contact night-coverage [email or web address]  12/03/2023, 7:23 AM

## 2023-12-03 NOTE — Plan of Care (Signed)
  Problem: Health Behavior/Discharge Planning: Goal: Ability to manage health-related needs will improve Outcome: Progressing   Problem: Clinical Measurements: Goal: Ability to maintain clinical measurements within normal limits will improve Outcome: Progressing   Problem: Pain Managment: Goal: General experience of comfort will improve and/or be controlled Outcome: Progressing

## 2023-12-04 DIAGNOSIS — K859 Acute pancreatitis without necrosis or infection, unspecified: Secondary | ICD-10-CM | POA: Diagnosis not present

## 2023-12-04 LAB — BASIC METABOLIC PANEL WITH GFR
Anion gap: 11 (ref 5–15)
BUN: 6 mg/dL (ref 6–20)
CO2: 24 mmol/L (ref 22–32)
Calcium: 9.5 mg/dL (ref 8.9–10.3)
Chloride: 102 mmol/L (ref 98–111)
Creatinine, Ser: 0.76 mg/dL (ref 0.44–1.00)
GFR, Estimated: >60 mL/min (ref >60)
Glucose, Bld: 80 mg/dL (ref 70–99)
Potassium: 3.6 mmol/L (ref 3.5–5.1)
Sodium: 137 mmol/L (ref 135–145)

## 2023-12-04 MED ORDER — LACTATED RINGERS IV SOLN: INTRAVENOUS | Status: AC

## 2023-12-04 NOTE — Plan of Care (Signed)
   Problem: Education: Goal: Knowledge of General Education information will improve Description Including pain rating scale, medication(s)/side effects and non-pharmacologic comfort measures Outcome: Progressing   Problem: Health Behavior/Discharge Planning: Goal: Ability to manage health-related needs will improve Outcome: Progressing

## 2023-12-04 NOTE — Progress Notes (Signed)
 PROGRESS NOTE    Sandy Allen  FMW:[id number] DOB: 04/19/2005 DOA: 12/01/2023 PCP: Sebastian Beverley NOVAK, MD   Brief Narrative: 18 year old with no significant past medical history presented with abdominal pain, generalized and epigastric.  Denies alcohol use.  Reports smoking marijuana.  Evaluation in the ED liver function test normal, CT abdomen and pelvis with mild uncomplicated pancreatitis.  Lipase 800.   Assessment & Plan:   Principal Problem:   Acute pancreatitis Active Problems:   Marijuana use  1-Acute pancreatitis: Unclear etiology - Patient presented with abdominal pain, found to have elevated lipase and mild pancreatitis on CT. - No evidence of gallstone.  Triglyceride 34. -  IgG4 pending.  Discussed with patient that marijuana can cause pancreatitis.  Advised cessation. Plan to advanced diet to low fat, and monitor overnight.   Anemia: - TIBC 18, plan to start Iron supplement at discharge Advised to follow up again with hematologist .   Estimated body mass index is 24.37 kg/m as calculated from the following:   Height as of this encounter: 5' 8 (1.727 m).   Weight as of this encounter: 72.7 kg.   DVT prophylaxis: Lovenox Code Status: Full code Family Communication: Care discussed with patient Disposition Plan:  Status is: Inpatient Remains inpatient appropriate because; management of acute pancreatitis    Consultants:  None  Procedures:  None  Antimicrobials:    Subjective: Pain improving. Required oral percocet last night.  Plan to advanced diet today   Objective: Vitals:   12/03/23 1412 12/03/23 2056 12/04/23 0500 12/04/23 0529  BP: (!) 123/55 133/77  135/62  Pulse: 67 (!) 59  63  Resp: 18 16  14   Temp: 98.5 F (36.9 C) 98.9 F (37.2 C)  98.4 F (36.9 C)  TempSrc: Oral Oral  Oral  SpO2: 100% 98%  100%  Weight:   72.7 kg   Height:        Intake/Output Summary (Last 24 hours) at 12/04/2023 0908 Last data filed at 12/04/2023  9388 Gross per 24 hour  Intake 3995.83 ml  Output 1300 ml  Net 2695.83 ml   Filed Weights   12/02/23 0500 12/03/23 0544 12/04/23 0500  Weight: 71.3 kg 72.5 kg 72.7 kg    Examination:  General exam: NAD Respiratory system: CTA Cardiovascular system: S 1, S 2 RRR Gastrointestinal system: BS present, soft, nt Central nervous system: none focal Extremities: no edema Data Reviewed: I have personally reviewed following labs and imaging studies  CBC: Recent Labs  Lab 12/01/23 2050 12/02/23 0455  WBC 8.9 7.6  NEUTROABS  --  4.0  HGB 9.8* 8.7*  HCT 31.7* 30.7*  MCV 78.5* 81.6  PLT 385 324   Basic Metabolic Panel: Recent Labs  Lab 12/01/23 2050 12/02/23 0455  NA 140 137  K 3.6 3.5  CL 103 106  CO2 27 23  GLUCOSE 94 88  BUN 13 10  CREATININE 0.82 0.61  CALCIUM 9.7 8.5*  MG  --  2.0   GFR: Estimated Creatinine Clearance: 113.2 mL/min (by C-G formula based on SCr of 0.61 mg/dL). Liver Function Tests: Recent Labs  Lab 12/01/23 2050 12/02/23 0455  AST 18 16  ALT 9 7  ALKPHOS 46 37*  BILITOT 0.6 0.6  PROT 8.0 6.3*  ALBUMIN 4.7 3.8   Recent Labs  Lab 12/01/23 2050  LIPASE 884*   No results for input(s): AMMONIA in the last 168 hours. Coagulation Profile: No results for input(s): INR, PROTIME in the last 168 hours. Cardiac Enzymes:  No results for input(s): CKTOTAL, CKMB, CKMBINDEX, TROPONINI in the last 168 hours. BNP (last 3 results) No results for input(s): PROBNP in the last 8760 hours. HbA1C: No results for input(s): HGBA1C in the last 72 hours. CBG: No results for input(s): GLUCAP in the last 168 hours. Lipid Profile: Recent Labs    12/01/23 2050 12/02/23 0455  TRIG 37 34   Thyroid Function Tests: No results for input(s): TSH, T4TOTAL, FREET4, T3FREE, THYROIDAB in the last 72 hours. Anemia Panel: Recent Labs    12/03/23 0818  VITAMINB12 511  FOLATE 11.4  FERRITIN 14  TIBC 400  IRON 73  RETICCTPCT 1.0    Sepsis Labs: No results for input(s): PROCALCITON, LATICACIDVEN in the last 168 hours.  No results found for this or any previous visit (from the past 240 hours).       Radiology Studies: No results found.       Scheduled Meds:  enoxaparin (LOVENOX) injection  40 mg Subcutaneous Q24H   pantoprazole (PROTONIX) IV  40 mg Intravenous Q12H   Continuous Infusions:  lactated ringers       LOS: 2 days    Time spent: 35 minutes    Najwa Spillane A Zeferino Mounts, MD [facility]   If 7PM-7AM, please contact night-coverage [email or web address]  12/04/2023, 9:08 AM

## 2023-12-04 NOTE — Plan of Care (Incomplete)
  Problem: Clinical Measurements: Goal: Ability to maintain clinical measurements within normal limits will improve Outcome: Progressing Goal: Will remain free from infection Outcome: Progressing   Problem: Activity: Goal: Risk for activity intolerance will decrease Outcome: Progressing   Problem: Nutrition: Goal: Adequate nutrition will be maintained Outcome: Progressing   Problem: Coping: Goal: Level of anxiety will decrease Outcome: Progressing   Problem: Pain Managment: Goal: General experience of comfort will improve and/or be controlled Outcome: Progressing

## 2023-12-05 DIAGNOSIS — K859 Acute pancreatitis without necrosis or infection, unspecified: Secondary | ICD-10-CM | POA: Diagnosis not present

## 2023-12-05 MED ORDER — PANTOPRAZOLE SODIUM 40 MG PO TBEC: 40.0000 mg | DELAYED_RELEASE_TABLET | Freq: Every day | ORAL | 0 refills | Status: AC

## 2023-12-05 MED ORDER — FERROUS SULFATE 325 (65 FE) MG PO TABS: 325.0000 mg | ORAL_TABLET | Freq: Every day | ORAL | 3 refills | Status: AC

## 2023-12-05 MED ORDER — HYDROCODONE-ACETAMINOPHEN 5-325 MG PO TABS: 1.0000 | ORAL_TABLET | Freq: Four times a day (QID) | ORAL | 0 refills | Status: AC | PRN

## 2023-12-05 NOTE — Discharge Instructions (Signed)
 Avoid Marihuana, increase risk of pancreatitis.  Follow up with your hematologist for further management of anemia. (Low Hb)

## 2023-12-05 NOTE — Discharge Summary (Signed)
 Physician Discharge Summary   Patient: Sandy Allen MRN: [id number] DOB: Mar 24, 2005  Admit date:     12/01/2023  Discharge date: 12/05/23  Discharge Physician: Owen DELENA Lore   PCP: Sebastian Beverley NOVAK, MD   Recommendations at discharge:    Needs further follow up for anemia Needs continue counseling about THC>   Discharge Diagnoses: Principal Problem:   Acute pancreatitis Active Problems:   Marijuana use  Resolved Problems:   * No resolved hospital problems. *  Hospital Course: 18 year old with no significant past medical history presented with abdominal pain, generalized and epigastric. Denies alcohol use. Reports smoking marijuana. Evaluation in the ED liver function test normal, CT abdomen and pelvis with mild uncomplicated pancreatitis. Lipase 800.   Assessment and Plan: 1-Acute pancreatitis: Unclear etiology - Patient presented with abdominal pain, found to have elevated lipase and mild pancreatitis on CT. - No evidence of gallstone.  Triglyceride 34. -  IgG4 pending.  Discussed with patient that marijuana can cause pancreatitis.  Advised cessation. Plan to advanced diet to low fat, and monitor overnight.    Anemia: - TIBC 18, plan to start Iron supplement at discharge Advised to follow up again with hematologist .          Consultants: None Procedures performed: none Disposition: Home Diet recommendation:  Discharge Diet Orders (From admission, onward)     Start     Ordered   12/05/23 0000  Diet - low sodium heart healthy        12/05/23 0921           Cardiac diet DISCHARGE MEDICATION: Allergies as of 12/05/2023   No Known Allergies      Medication List     STOP taking these medications    methocarbamol  500 MG tablet Commonly known as: ROBAXIN        TAKE these medications    ferrous sulfate  325 (65 FE) MG tablet Take 1 tablet (325 mg total) by mouth daily with breakfast.   HYDROcodone-acetaminophen  5-325 MG  tablet Commonly known as: NORCO/VICODIN Take 1 tablet by mouth every 6 (six) hours as needed for up to 2 days for moderate pain (pain score 4-6).   pantoprazole 40 MG tablet Commonly known as: Protonix Take 1 tablet (40 mg total) by mouth daily.   traZODone  50 MG tablet Commonly known as: DESYREL  Take 1 tablet (50 mg total) by mouth at bedtime.        Follow-up Information     Sebastian Beverley NOVAK, MD Follow up in 1 week(s).   Specialty: Family Medicine Contact information: [address] [place] [address] [phone]                Discharge Exam: Fredricka Weights   12/03/23 0544 12/04/23 0500 12/05/23 0549  Weight: 72.5 kg 72.7 kg 69.9 kg   General; NAD  Condition at discharge: stable  The results of significant diagnostics from this hospitalization (including imaging, microbiology, ancillary and laboratory) are listed below for reference.   Imaging Studies: CT ABDOMEN PELVIS W CONTRAST Result Date: 12/01/2023 EXAM: CT ABDOMEN AND PELVIS WITH CONTRAST 12/01/2023 10:55:22 PM TECHNIQUE: CT of the abdomen and pelvis was performed with the administration of 85 mL of iohexol (OMNIPAQUE) 300 MG/ML solution. Multiplanar reformatted images are provided for review. Automated exposure control, iterative reconstruction, and/or weight-based adjustment of the mA/kV was utilized to reduce the radiation dose to as low as reasonably achievable. COMPARISON: None available. CLINICAL HISTORY: Abdominal pain, acute, nonlocalized; Elevated lipase. Pt c/o  epigastric pain after meals x 5 days, possibly late on menstrual cycle. LMP 10/26/23. Upper abd pain rated 4/10, worse after eating, tenderness in RUQ. FINDINGS: LOWER CHEST: No acute abnormality. LIVER: The liver is unremarkable. GALLBLADDER AND BILE DUCTS: Gallbladder is unremarkable. No biliary ductal dilatation. SPLEEN: No acute abnormality. PANCREAS: Mild peripancreatic fluid/inflammatory changes along the pancreatic tail  (image 25), suggesting uncomplicated acute pancreatitis. ADRENAL GLANDS: No acute abnormality. KIDNEYS, URETERS AND BLADDER: No stones in the kidneys or ureters. No hydronephrosis. No perinephric or periureteral stranding. Urinary bladder is unremarkable. GI AND BOWEL: Stomach demonstrates no acute abnormality. There is no bowel obstruction. Normal appendix (image 62). PERITONEUM AND RETROPERITONEUM: No ascites. No free air. VASCULATURE: Aorta is normal in caliber. LYMPH NODES: No lymphadenopathy. REPRODUCTIVE ORGANS: The uterus is within normal limits. BONES AND SOFT TISSUES: No acute osseous abnormality. No focal soft tissue abnormality. IMPRESSION: 1. Mild uncomplicated acute pancreatitis. Electronically signed by: Pinkie Pebbles MD 12/01/2023 10:58 PM EDT RP Workstation: [id number]    Microbiology: Results for orders placed or performed during the hospital encounter of 07/02/20  Resp panel by RT-PCR (RSV, Flu A&B, Covid) Nasopharyngeal Swab     Status: None   Collection Time: 07/02/20  7:28 PM   Specimen: Nasopharyngeal Swab; Nasopharyngeal(NP) swabs in vial transport medium  Result Value Ref Range Status   SARS Coronavirus 2 by RT PCR NEGATIVE NEGATIVE Final    Comment: (NOTE) SARS-CoV-2 target nucleic acids are NOT DETECTED.  The SARS-CoV-2 RNA is generally detectable in upper respiratory specimens during the acute phase of infection. The lowest concentration of SARS-CoV-2 viral copies this assay can detect is 138 copies/mL. A negative result does not preclude SARS-Cov-2 infection and should not be used as the sole basis for treatment or other patient management decisions. A negative result may occur with  improper specimen collection/handling, submission of specimen other than nasopharyngeal swab, presence of viral mutation(s) within the areas targeted by this assay, and inadequate number of viral copies(<138 copies/mL). A negative result must be combined with clinical observations,  patient history, and epidemiological information. The expected result is Negative.  Fact Sheet for Patients:  BloggerCourse.com  Fact Sheet for Healthcare Providers:  SeriousBroker.it  This test is no t yet approved or cleared by the [place]  FDA and  has been authorized for detection and/or diagnosis of SARS-CoV-2 by FDA under an Emergency Use Authorization (EUA). This EUA will remain  in effect (meaning this test can be used) for the duration of the COVID-19 declaration under Section 564(b)(1) of the Act, 21 U.S.C.section 360bbb-3(b)(1), unless the authorization is terminated  or revoked sooner.       Influenza A by PCR NEGATIVE NEGATIVE Final   Influenza B by PCR NEGATIVE NEGATIVE Final    Comment: (NOTE) The Xpert Xpress SARS-CoV-2/FLU/RSV plus assay is intended as an aid in the diagnosis of influenza from Nasopharyngeal swab specimens and should not be used as a sole basis for treatment. Nasal washings and aspirates are unacceptable for Xpert Xpress SARS-CoV-2/FLU/RSV testing.  Fact Sheet for Patients: BloggerCourse.com  Fact Sheet for Healthcare Providers: SeriousBroker.it  This test is not yet approved or cleared by the [place]  FDA and has been authorized for detection and/or diagnosis of SARS-CoV-2 by FDA under an Emergency Use Authorization (EUA). This EUA will remain in effect (meaning this test can be used) for the duration of the COVID-19 declaration under Section 564(b)(1) of the Act, 21 U.S.C. section 360bbb-3(b)(1), unless the authorization is terminated or revoked.  Resp Syncytial Virus by PCR NEGATIVE NEGATIVE Final    Comment: (NOTE) Fact Sheet for Patients: BloggerCourse.com  Fact Sheet for Healthcare Providers: SeriousBroker.it  This test is not yet approved or cleared by the [place] FDA and has been authorized for detection and/or diagnosis of SARS-CoV-2 by FDA under an Emergency Use Authorization (EUA). This EUA will remain in effect (meaning this test can be used) for the duration of the COVID-19 declaration under Section 564(b)(1) of the Act, 21 U.S.C. section 360bbb-3(b)(1), unless the authorization is terminated or revoked.  Performed at [facility], [address] [place], [place] [address]     Labs: CBC: Recent Labs  Lab 12/01/23 2050 12/02/23 0455  WBC 8.9 7.6  NEUTROABS  --  4.0  HGB 9.8* 8.7*  HCT 31.7* 30.7*  MCV 78.5* 81.6  PLT 385 324   Basic Metabolic Panel: Recent Labs  Lab 12/01/23 2050 12/02/23 0455 12/04/23 0533  NA 140 137 137  K 3.6 3.5 3.6  CL 103 106 102  CO2 27 23 24   GLUCOSE 94 88 80  BUN 13 10 6   CREATININE 0.82 0.61 0.76  CALCIUM 9.7 8.5* 9.5  MG  --  2.0  --    Liver Function Tests: Recent Labs  Lab 12/01/23 2050 12/02/23 0455  AST 18 16  ALT 9 7  ALKPHOS 46 37*  BILITOT 0.6 0.6  PROT 8.0 6.3*  ALBUMIN 4.7 3.8   CBG: No results for input(s): GLUCAP in the last 168 hours.  Discharge time spent: greater than 30 minutes.  Signed: Owen DELENA Lore, MD [facility] 12/05/2023

## 2023-12-05 NOTE — TOC Initial Note (Signed)
 Transition of Care [facility]) - Initial/Assessment Note    Patient Details  Name: Sandy Allen MRN: [id number] Date of Birth: 2005/06/07  Transition of Care [facility]) CM/SW Contact:    Sonda Manuella Quill, RN Phone Number: 12/05/2023, 9:32 AM  Clinical Narrative:                 Spoke w/ pt in room; pt said she lives at home w/ family; she plans to return at d/c; pt identified POC mother Jarome Oman [phone]); she will provide transportation; pt verified insurance/PCP; she denied SDOH risks; pt does not have DME, HH services, or home oxygen; no IP CM needs.  Expected Discharge Plan: Home/Self Care Barriers to Discharge: No Barriers Identified   Patient Goals and CMS Choice Patient states their goals for this hospitalization and ongoing recovery are:: home          Expected Discharge Plan and Services   Discharge Planning Services: CM Consult Post Acute Care Choice: NA Living arrangements for the past 2 months: Single Family Home Expected Discharge Date: 12/05/23               DME Arranged: N/A DME Agency: NA       HH Arranged: NA HH Agency: NA        Prior Living Arrangements/Services Living arrangements for the past 2 months: Single Family Home Lives with:: Relatives Patient language and need for interpreter reviewed:: Yes Do you feel safe going back to the place where you live?: Yes      Need for Family Participation in Patient Care: Yes (Comment) Care giver support system in place?: Yes (comment) Current home services:  (n/a) Criminal Activity/Legal Involvement Pertinent to Current Situation/Hospitalization: No - Comment as needed  Activities of Daily Living   ADL Screening (condition at time of admission) Independently performs ADLs?: Yes (appropriate for developmental age) Is the patient deaf or have difficulty hearing?: No Does the patient have difficulty seeing, even when wearing glasses/contacts?: No Does the patient have difficulty  concentrating, remembering, or making decisions?: No  Permission Sought/Granted Permission sought to share information with : Case Manager Permission granted to share information with : Yes, Verbal Permission Granted  Share Information with NAME: Case Manager     Permission granted to share info w Relationship: Jarome Oman (mother) [phone]     Emotional Assessment Appearance:: Appears stated age Attitude/Demeanor/Rapport: Gracious Affect (typically observed): Accepting Orientation: : Oriented to Self, Oriented to Place, Oriented to  Time, Oriented to Situation Alcohol / Substance Use: Not Applicable Psych Involvement: No (comment)  Admission diagnosis:  Acute pancreatitis [K85.90] Acute pancreatitis, unspecified complication status, unspecified pancreatitis type [K85.90] Patient Active Problem List   Diagnosis Date Noted   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   Acute pancreatitis 12/01/2023   Nausea 07/10/2022   Anemia 07/10/2022   Elevated bilirubin 07/10/2022   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   Moderate episode of recurrent major depressive disorder (HCC) 06/12/2022   Encounter for female birth control 06/12/2022   Anxiety 06/12/2022   Chalazion of both eyes 06/12/2022   PCP:  Sebastian Beverley NOVAK, MD Pharmacy:   [facility] [phone] [address] [place], [place] - [address] AT [facility] [address] [place] [address] Phone: [phone] Fax: [phone]     Social Drivers of Health (SDOH) Social History: SDOH Screenings   Food Insecurity: No Food Insecurity (12/05/2023)  Housing: Low Risk  (12/05/2023)  Transportation Needs: No Transportation Needs (12/05/2023)  Utilities: Not At Risk (12/05/2023)  Depression (PHQ2-9): Low Risk  (07/10/2022)  Recent Concern: Depression (PHQ2-9) - Medium Risk (06/12/2022)  Tobacco Use: Low Risk  (12/01/2023)   SDOH Interventions: Food Insecurity Interventions: Intervention Not Indicated,  Inpatient TOC Housing Interventions: Intervention Not Indicated, Inpatient TOC Transportation Interventions: Intervention Not Indicated, Inpatient TOC Utilities Interventions: Intervention Not Indicated, Inpatient TOC   Readmission Risk Interventions     No data to display

## 2023-12-05 NOTE — Progress Notes (Signed)
 Assessment unchanged. Verbalized understanding of dc instructions including medications to resume and when to call the doctor. Discharged via wc to front entrance accompanied by NT.

## 2023-12-05 NOTE — Plan of Care (Signed)

## 2023-12-06 ENCOUNTER — Telehealth: Payer: Self-pay

## 2023-12-06 LAB — IGG 4: IgG, Subclass 4: 19 mg/dL (ref 2–96)

## 2023-12-06 NOTE — Transitions of Care (Post Inpatient/ED Visit) (Signed)
   12/06/2023  Name: Sandy Allen MRN: [id number] DOB: 07-01-2005  Today's TOC FU Call Status: Today's TOC FU Call Status:: Unsuccessful Call (1st Attempt) Unsuccessful Call (1st Attempt) Date: 12/06/23  Attempted to reach the patient regarding the most recent Inpatient/ED visit.  Follow Up Plan: Additional outreach attempts will be made to reach the patient to complete the Transitions of Care (Post Inpatient/ED visit) call.   Signature  Charmaine Bloodgood, LPN [facility] Health Advisor [place] [facility] You Are. We Are. [facility] Direct Dial [phone]

## 2024-02-29 ENCOUNTER — Telehealth

## 2024-03-01 ENCOUNTER — Telehealth: Admitting: Physician Assistant

## 2024-03-01 DIAGNOSIS — K2900 Acute gastritis without bleeding: Secondary | ICD-10-CM | POA: Diagnosis not present

## 2024-03-01 NOTE — Progress Notes (Signed)
 " Virtual Visit Consent   Sandy Allen, you are scheduled for a virtual visit with a [facility] provider today. Just as with appointments in the office, your consent must be obtained to participate. Your consent will be active for this visit and any virtual visit you may have with one of our providers in the next 365 days. If you have a MyChart account, a copy of this consent can be sent to you electronically.  As this is a virtual visit, video technology does not allow for your provider to perform a traditional examination. This may limit your provider's ability to fully assess your condition. If your provider identifies any concerns that need to be evaluated in person or the need to arrange testing (such as labs, EKG, etc.), we will make arrangements to do so. Although advances in technology are sophisticated, we cannot ensure that it will always work on either your end or our end. If the connection with a video visit is poor, the visit may have to be switched to a telephone visit. With either a video or telephone visit, we are not always able to ensure that we have a secure connection.  By engaging in this virtual visit, you consent to the provision of healthcare and authorize for your insurance to be billed (if applicable) for the services provided during this visit. Depending on your insurance coverage, you may receive a charge related to this service.  I need to obtain your verbal consent now. Are you willing to proceed with your visit today? Sandy Allen has provided verbal consent on 03/01/2024 for a virtual visit (video or telephone). Sandy Allen, [place]  Date: 03/01/2024 3:11 PM   Virtual Visit via Video Note   I, Sandy Allen, connected with  BENTLEY HARALSON  ([id number], 07/14/2005) on 03/01/2024 at  3:00 PM EST by a video-enabled telemedicine application and verified that I am speaking with the correct person using two identifiers.  Location: Patient: Virtual Visit  Location Patient: Home Provider: Virtual Visit Location Provider: Home Office   I discussed the limitations of evaluation and management by telemedicine and the availability of in person appointments. The patient expressed understanding and agreed to proceed.    History of Present Illness: Sandy Allen is a 19 y.o. who identifies as a female who was assigned female at birth, and is being seen today for some epigastric/LUQ discomfort starting a few days ago. Started after a meal on Sunday. Noted some tenderness and nausea without vomiting. Initially some loose stool that quickly resolved. Thought viral as her significant other has been sick with a cold last week. She took home COVID/Flu test as a precaution but these were negative. Notes symptoms easing up since then, with her starting with a liquid diet. Today without nausea or tenderness. Notes some continued reflux so restarted Protonix  she was previously prescribed. Of note, has history of acute pancreatitis (October 2025) felt to be cannabis-induced. Denies denies any alcohol consumption or cannabis use. Notes diet has not been the best lately. Also noting increased stressors the past few weeks. She does note having her menstrual period last week and taking a few tablets of Ibuprofen . No other NSAID use.   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  Resting comfortably  at home.  Head is  normocephalic, atraumatic.  No labored breathing.  Speech is clear and coherent with logical content.  Patient is alert and oriented at baseline.   Assessment and Plan: 1. Acute gastritis without hemorrhage, unspecified gastritis type (Primary)  Most likely mild gastritis from dietary changes, NSAID use and increased stressors. No alarm signs/symptoms present. Already improving with dietary changes. Cannot fully rule out pancreatitis without labs but giving short duration of discomfort, no alarm signs/symptoms currently and associated history to suggest gastritis, do not feel urgent workup indicated presently.  Will have her follow GERD/bland diets when she start to reintroduce foods. Continue good hydration. Continue Protonix  daily for 2 weeks -- refill sent. Avoid NSAIDs. Continue to refrain from alcohol and cannabis.  Sleep hygiene practices and stress relief practices reviewed. Resources placed in AVS.   Want her to call PCP to schedule follow-up within next 10 days. If you note any non-resolving, new, or worsening symptoms despite treatment, please seek an in-person evaluation ASAP.   Follow Up Instructions: I discussed the assessment and treatment plan with the patient. The patient was provided an opportunity to ask questions and all were answered. The patient agreed with the plan and demonstrated an understanding of the instructions.  A copy of instructions were sent to the patient via MyChart unless otherwise noted below.   The patient was advised to call back or seek an in-person evaluation if the symptoms worsen or if the condition fails to improve as anticipated.    Sandy Velma Lunger, PA-C    [1] No Known Allergies [2]  Current Outpatient Medications:    ferrous sulfate  325 (65 FE) MG tablet, Take 1 tablet (325 mg total) by mouth daily with breakfast., Disp: 30 tablet, Rfl: 3   pantoprazole  (PROTONIX ) 40 MG tablet, Take 1 tablet (40 mg total) by mouth daily., Disp: 30  tablet, Rfl: 0   traZODone  (DESYREL ) 50 MG tablet, Take 1 tablet (50 mg total) by mouth at bedtime., Disp: 30 tablet, Rfl: 3  "

## 2024-03-01 NOTE — Patient Instructions (Signed)
 " Sandy Allen, thank you for joining [name], PA-C for today's virtual visit.  While this provider is not your primary care provider (PCP), if your PCP is located in our provider database this encounter information will be shared with them immediately following your visit.   A [facility] MyChart account gives you access to today's visit and all your visits, tests, and labs performed at [facility]  click here if you don't have a [facility] MyChart account or go to [email or web address]  Consent: (Patient) Sandy Allen provided verbal consent for this virtual visit at the beginning of the encounter.  Current Medications:  Current Outpatient Medications:    ferrous sulfate  325 (65 FE) MG tablet, Take 1 tablet (325 mg total) by mouth daily with breakfast., Disp: 30 tablet, Rfl: 3   pantoprazole  (PROTONIX ) 40 MG tablet, Take 1 tablet (40 mg total) by mouth daily., Disp: 30 tablet, Rfl: 0   traZODone  (DESYREL ) 50 MG tablet, Take 1 tablet (50 mg total) by mouth at bedtime., Disp: 30 tablet, Rfl: 3   Medications ordered in this encounter:  No orders of the defined types were placed in this encounter.    *If you need refills on other medications prior to your next appointment, please contact your pharmacy*  Follow-Up: Call back or seek an in-person evaluation if the symptoms worsen or if the condition fails to improve as anticipated.  [facility] [phone]  Other Instructions Hydrate and rest. Start a bland diet (see below) now that pain is resolved and you are keeping hydrated. Continue the Pantoprazole  (Protonix ) daily for next 14 days -- I have sent in a refill Avoid NSAIDs (Ibuprofen , Naproxen) and Aspirin. Avoid alcohol and refrain from marijuana use.  Symptoms should continue to resolve. If you note any non-resolving, new, or worsening symptoms despite treatment, please seek an in-person evaluation ASAP. Make sure to contact  your PCP office to schedule a follow-up in next 10 days.   Sleep Hygiene  Do: (1) Go to bed at the same time each day. (2) Get up from bed at the same time each day. (3) Get regular exercise each day, preferably in the morning.  There is goof evidence that regular exercise improves restful sleep.  This includes stretching and aerobic exercise. (4) Get regular exposure to outdoor or bright lights, especially in the late afternoon. (5) Keep the temperature in your bedroom comfortable. (6) Keep the bedroom quiet when sleeping. (7) Keep the bedroom dark enough to facilitate sleep. (8) Use your bed only for sleep and sex. (9) Take medications as directed.  It is helpful to take prescribed sleeping pills 1 hour before bedtime, so they are causing drowsiness when you lie down, or 10 hours before getting up, to avoid daytime drowsiness. (10) Use a relaxation exercise just before going to sleep -- imagery, massage, warm bath. (11) Keep your feet and hands warm.  Wear warm socks and/or mittens or gloves to bed.  Don't: (1) Exercise just before going to bed. (2) Engage in stimulating activity just before bed, such as playing a competitive game, watching an exciting program on television, or having an important discussion with a loved one. (3) Have caffeine in the evening (coffee, teas, chocolate, sodas, etc.) (4) Read or watch television in bed. (5) Use alcohol to help you sleep. (6) Go to bed too hungry or too full. (7) Take another person's sleeping pills. (8) Take over-the-counter sleeping pills, without your doctor's knowledge.  Tolerance  can develop rapidly with these medications.  Diphenhydramine can have serious side effects for elderly patients. (9) Take daytime naps. (10) Command yourself to go to sleep.  This only makes your mind and body more alert.  If you lie awake for more than 20-30 minutes, get up, go to a different room, participate in a quiet activity (Ex - non-excitable reading  or television), and then return to bed when you feel sleepy.  Do this as many times during the night as needed.  This may cause you to have a night or two of poor sleep but it will train your brain to know when it is time for sleep.  GERD in Adults: Diet Changes When you have gastroesophageal reflux disease (GERD), you may need to make changes to your diet. Choosing the right foods can help with your symptoms. Think about working with an expert in healthy eating called a dietitian. They can help you make healthy food choices. What are tips for following this plan? Reading food labels Look for foods that are low in saturated fat. Foods that may help with your symptoms include: Foods with less than 5% of daily value (DV) of fat. Foods with 0 grams of trans fat. Cooking [employer] in ways that don't use a lot of fat. These ways include: Baking. Steaming. Grilling. Broiling. To add flavor, try to use herbs that are low in spice and acidity. Avoid frying your food. Meal planning  Eat small meals often rather than eating 3 large meals each day. Eat your meals slowly in a place where you feel relaxed. If told by your health care provider, avoid: Foods that cause symptoms. Keep a food diary to keep track of foods that cause symptoms. Alcohol. Drinking a lot of liquid with meals. General instructions For 2-3 hours after you eat, avoid: Bending over. Exercise. Lying down. Chew sugar-free gum after meals. What foods should I eat? Eat a healthy diet. Try to include: Foods with high amounts of fiber. These include: Fruits and vegetables. Whole grains and beans. Low-fat dairy products. Lean meats, fish, and poultry. Egg whites. Foods that cause symptoms in someone else may not cause symptoms for you. Work with your provider to find foods that are safe for you. The items listed above may not be all the foods and drinks you can have. Talk with a dietitian to learn more. The items listed  above may not be a complete list of foods and beverages you can eat and drink. Contact a dietitian for more information. What foods should I avoid? Limiting some of these foods may help with your symptoms. Each person is different. Talk with a dietitian or your provider to help you find the exact foods to avoid. Some of the foods to avoid may include: Fruits Fruits with a lot of acid in them. These may include citrus fruits, such as oranges, grapefruit, pineapple, and lemons. Vegetables Deep-fried vegetables, such as French fries. Vegetables, sauces, or toppings made with added fat and vegetables with acid in them. These may include tomatoes and tomato products, chili peppers, onions, garlic, and horseradish. Grains Pastries or quick breads with added fat. Meats and other proteins High-fat meats, such as fatty beef or pork, hot dogs, ribs, ham, sausage, salami, and bacon. Fried meat or protein, such as fried fish and fried chicken. Egg yolks. Fats and oils Butter. Margarine. Shortening. Ghee. Drinks Coffee and other drinks with caffeine in them. Fizzy and sugary drinks, such as soda and energy drinks.  Fruit juice made with acidic fruits, such as orange or grapefruit. Tomato juice. Sweets and desserts Chocolate and cocoa. Donuts. Seasonings and condiments Mint, such as peppermint and spearmint. Condiments, herbs, or seasonings that cause symptoms. These may include curry, hot sauce, or vinegar-based salad dressings. The items listed above may not be all the foods and drinks you should avoid. Talk with a dietitian to learn more. Questions to ask your health care provider Changes to your diet and everyday life are often the first steps taken to manage symptoms of GERD. If these changes don't help, talk with your provider about taking medicines. Where to find more information [employer] G[employer]intestinal [employer]: aboutgerd.org This information is not intended to replace  advice given to you by your health care provider. Make sure you discuss any questions you have with your health care provider. Document Revised: 12/15/2022 Document Reviewed: 07/01/2022 Elsevier Patient Education  2024 Else[employer].Bland Diet A bland diet may consist of soft foods or foods that are not high in fat or are not greasy, acidic, or spicy. Avoiding certain foods may cause less irritation to your mouth, throat, stomach, or gastrointestinal tract. Avoiding certain foods may make you feel better. Everyone's tolerances are different. A bland diet should be based on what you can tolerate and what may cause discomfort. What is my plan? Your health care provider or dietitian may recommend specific changes to your diet to treat your symptoms. These changes may include: Eating small meals frequently. Cooking food until it is soft enough to chew easily. Taking the time to chew your food thoroughly, so it is easy to swallow and digest. Avoiding foods that cause you discomfort. These may include spicy food, fried food, greasy foods, hard-to-chew foods, or citrus fruits and juices. Drinking slowly. What are tips for following this plan? Reading food labels To reduce fiber intake, look for food labels that say whole, such as whole wheat or whole grain. Shopping Avoid food items that may have nuts or seeds. Avoid vegetables that may make you gassy or have a tough texture, such as broccoli, cauliflower, or corn. Cooking Cook foods thoroughly so they have a soft texture. Meal planning Make sure you include foods from all food groups to eat a balanced diet. Eat a variety of types of foods. Eat foods and drink beverages that do not cause you discomfort. These may include soups and broths with cooked meats, pasta, and vegetables. Lifestyle Sit up after meals, avoid tight clothing, and take time to eat and chew your food slowly. Ask your health care provider whether you should take dietary  supplements. General information Mildly season your foods. Some seasonings, such as cayenne pepper, vinegar, or hot sauce, may cause irritation. The foods, beverages, or seasonings to avoid should be based on individual tolerance. What foods should I eat? Fruits Canned or cooked fruit such as peaches, pears, or applesauce. Bananas. Vegetables Well-cooked vegetables. Canned or cooked vegetables such as carrots, green beans, beets, or spinach. Mashed or boiled potatoes. Grains  Hot cereals, such as cream of wheat and processed oatmeal. Rice. Bread, crackers, pasta, or tortillas made from refined white flour. Meats and other proteins  Eggs. Creamy peanut butter or other nut butters. Lean, well-cooked tender meats, such as beef, pork, chicken, or fish. Dairy Low-fat dairy products such as milk, cottage cheese, or yogurt. Beverages  Water. Herbal tea. Apple juice. Fats and oils Mild salad dressings. Canola or olive oil. Sweets and desserts Low-fat pudding, custard, or ice cream. Fruit  gelatin. The items listed above may not be a complete list of foods and beverages you can eat. Contact a dietitian for more information. What foods should I avoid? Fruits Citrus fruits, such as oranges and grapefruit. Fruits with a stringy texture. Fruits that have lots of seeds, such as kiwi or strawberries. Dried fruits. Vegetables Raw, uncooked vegetables. Salads. Grains Whole grain breads, muffins, and cereals. Meats and other proteins Tough, fibrous meats. Highly seasoned meat such as corned beef, smoked meats, or fish. Processed high-fat meats such as brats, hot dogs, or sausage. Dairy Full-fat dairy foods such as ice cream and cheese. Beverages Caffeinated drinks. Alcohol. Seasonings and condiments Strongly flavored seasonings or condiments. Hot sauce. Salsa. Other foods Spicy foods. Fried or greasy foods. Sour foods, such as pickled or fermented foods like sauerkraut. Foods high in  fiber. The items listed above may not be a complete list of foods and beverages you should avoid. Contact a dietitian for more information. Summary A bland diet should be based on individual tolerance. It may consist of foods that are soft textured and do not have a lot of fat, fiber, acid, or seasonings. A bland diet may be recommended because avoiding certain foods, beverages, or spices may make you feel better. This information is not intended to replace advice given to you by your health care provider. Make sure you discuss any questions you have with your health care provider. Document Revised: 12/23/2020 Document Reviewed: 12/23/2020 Elsevier Patient Education  2024 Elsevier Inc.   If you have been instructed to have an in-person evaluation today at a local Urgent Care facility, please use the link below. It will take you to a list of all of our available [facility], including address, phone number and hours of operation. Please do not delay care.  [facility]  If you or a family member do not have a primary care provider, use the link below to schedule a visit and establish care. When you choose a [facility] primary care physician or advanced practice provider, you gain a long-term partner in health. Find a Primary Care Provider  Learn more about [facility] in-office and virtual care options: [facility] Get Care Now  "
